# Patient Record
Sex: Female | Born: 1965 | Race: White | Hispanic: No | Smoking: Current every day smoker
Health system: Southern US, Community
[De-identification: ages and names within clinical notes are randomized; demographics above are authoritative.]

## PROBLEM LIST (undated history)

## (undated) DIAGNOSIS — M1990 Unspecified osteoarthritis, unspecified site: Secondary | ICD-10-CM

## (undated) DIAGNOSIS — D649 Anemia, unspecified: Secondary | ICD-10-CM

## (undated) DIAGNOSIS — K5190 Ulcerative colitis, unspecified, without complications: Secondary | ICD-10-CM

## (undated) DIAGNOSIS — G629 Polyneuropathy, unspecified: Secondary | ICD-10-CM

## (undated) DIAGNOSIS — L80 Vitiligo: Secondary | ICD-10-CM

## (undated) DIAGNOSIS — Z87442 Personal history of urinary calculi: Secondary | ICD-10-CM

## (undated) DIAGNOSIS — K589 Irritable bowel syndrome without diarrhea: Secondary | ICD-10-CM

## (undated) DIAGNOSIS — N809 Endometriosis, unspecified: Secondary | ICD-10-CM

## (undated) DIAGNOSIS — M797 Fibromyalgia: Secondary | ICD-10-CM

## (undated) DIAGNOSIS — G43909 Migraine, unspecified, not intractable, without status migrainosus: Secondary | ICD-10-CM

## (undated) DIAGNOSIS — E538 Deficiency of other specified B group vitamins: Secondary | ICD-10-CM

## (undated) DIAGNOSIS — E079 Disorder of thyroid, unspecified: Secondary | ICD-10-CM

## (undated) DIAGNOSIS — E039 Hypothyroidism, unspecified: Secondary | ICD-10-CM

## (undated) DIAGNOSIS — F32A Depression, unspecified: Secondary | ICD-10-CM

## (undated) DIAGNOSIS — A6000 Herpesviral infection of urogenital system, unspecified: Secondary | ICD-10-CM

## (undated) DIAGNOSIS — K219 Gastro-esophageal reflux disease without esophagitis: Secondary | ICD-10-CM

## (undated) DIAGNOSIS — F419 Anxiety disorder, unspecified: Secondary | ICD-10-CM

## (undated) DIAGNOSIS — N059 Unspecified nephritic syndrome with unspecified morphologic changes: Secondary | ICD-10-CM

## (undated) HISTORY — PX: COLONOSCOPY: SHX174

## (undated) HISTORY — PX: TUBAL LIGATION: SHX77

## (undated) HISTORY — DX: Migraine, unspecified, not intractable, without status migrainosus: G43.909

## (undated) HISTORY — DX: Unspecified nephritic syndrome with unspecified morphologic changes: N05.9

## (undated) HISTORY — DX: Irritable bowel syndrome, unspecified: K58.9

## (undated) HISTORY — DX: Disorder of thyroid, unspecified: E07.9

## (undated) HISTORY — DX: Fibromyalgia: M79.7

## (undated) HISTORY — PX: TONSILLECTOMY: SUR1361

## (undated) HISTORY — PX: ABDOMINAL HYSTERECTOMY: SHX81

## (undated) HISTORY — PX: HERNIA REPAIR: SHX51

## (undated) HISTORY — DX: Vitiligo: L80

---

## 2001-10-09 ENCOUNTER — Encounter: Payer: Self-pay | Admitting: *Deleted

## 2001-10-09 ENCOUNTER — Encounter: Admission: RE | Admit: 2001-10-09 | Discharge: 2001-10-09 | Payer: Self-pay | Admitting: *Deleted

## 2001-12-10 ENCOUNTER — Other Ambulatory Visit: Admission: RE | Admit: 2001-12-10 | Discharge: 2001-12-10 | Payer: Self-pay | Admitting: *Deleted

## 2001-12-10 ENCOUNTER — Encounter (INDEPENDENT_AMBULATORY_CARE_PROVIDER_SITE_OTHER): Payer: Self-pay

## 2001-12-24 ENCOUNTER — Encounter: Payer: Self-pay | Admitting: *Deleted

## 2001-12-24 ENCOUNTER — Ambulatory Visit (HOSPITAL_COMMUNITY): Admission: RE | Admit: 2001-12-24 | Discharge: 2001-12-24 | Payer: Self-pay | Admitting: *Deleted

## 2002-04-03 ENCOUNTER — Encounter (INDEPENDENT_AMBULATORY_CARE_PROVIDER_SITE_OTHER): Payer: Self-pay

## 2002-04-04 ENCOUNTER — Inpatient Hospital Stay (HOSPITAL_COMMUNITY): Admission: RE | Admit: 2002-04-04 | Discharge: 2002-04-05 | Payer: Self-pay | Admitting: Obstetrics and Gynecology

## 2002-04-12 ENCOUNTER — Inpatient Hospital Stay (HOSPITAL_COMMUNITY): Admission: AD | Admit: 2002-04-12 | Discharge: 2002-04-12 | Payer: Self-pay | Admitting: Obstetrics and Gynecology

## 2002-04-12 ENCOUNTER — Encounter: Payer: Self-pay | Admitting: Obstetrics and Gynecology

## 2002-04-16 ENCOUNTER — Inpatient Hospital Stay (HOSPITAL_COMMUNITY): Admission: AD | Admit: 2002-04-16 | Discharge: 2002-04-19 | Payer: Self-pay | Admitting: Obstetrics and Gynecology

## 2004-03-22 ENCOUNTER — Other Ambulatory Visit: Admission: RE | Admit: 2004-03-22 | Discharge: 2004-03-22 | Payer: Self-pay | Admitting: Obstetrics and Gynecology

## 2006-03-17 ENCOUNTER — Encounter: Admission: RE | Admit: 2006-03-17 | Discharge: 2006-03-17 | Payer: Self-pay | Admitting: Physician Assistant

## 2006-05-03 ENCOUNTER — Encounter: Admission: RE | Admit: 2006-05-03 | Discharge: 2006-05-03 | Payer: Self-pay | Admitting: Family Medicine

## 2006-11-16 ENCOUNTER — Ambulatory Visit: Payer: Self-pay | Admitting: Hematology & Oncology

## 2006-12-29 LAB — CBC WITH DIFFERENTIAL/PLATELET
BASO%: 1.6 % (ref 0.0–2.0)
EOS%: 1.2 % (ref 0.0–7.0)
MCH: 28.8 pg (ref 26.0–34.0)
MCHC: 33.7 g/dL (ref 32.0–36.0)
MONO#: 1.4 10*3/uL — ABNORMAL HIGH (ref 0.1–0.9)
RDW: 15.8 % — ABNORMAL HIGH (ref 11.3–14.5)
WBC: 14.9 10*3/uL — ABNORMAL HIGH (ref 3.9–10.0)
lymph#: 6.5 10*3/uL — ABNORMAL HIGH (ref 0.9–3.3)

## 2006-12-29 LAB — CHCC SMEAR

## 2007-01-05 LAB — JAK2 GENOTYPR

## 2008-10-01 ENCOUNTER — Encounter: Admission: RE | Admit: 2008-10-01 | Discharge: 2008-10-01 | Payer: Self-pay | Admitting: Family Medicine

## 2009-05-22 ENCOUNTER — Emergency Department: Payer: Self-pay | Admitting: Emergency Medicine

## 2009-06-10 ENCOUNTER — Ambulatory Visit: Payer: Self-pay | Admitting: Gastroenterology

## 2009-06-11 ENCOUNTER — Ambulatory Visit: Payer: Self-pay | Admitting: Gastroenterology

## 2009-06-24 ENCOUNTER — Ambulatory Visit: Payer: Self-pay | Admitting: Gastroenterology

## 2010-06-25 NOTE — Op Note (Signed)
[name], [name]                           ACCOUNT NO.:  [id number]   MEDICAL RECORD NO.:  [id number]                   PATIENT TYPE:  OUT   LOCATION:  XRAY                                 FACILITY:  [facility]   PHYSICIAN:  Janine Limbo, M.D.            DATE OF BIRTH:  [birth year]   DATE OF PROCEDURE:  04/03/2002  DATE OF DISCHARGE:  12/24/2001                                 OPERATIVE REPORT   PREOPERATIVE DIAGNOSIS:  1. Fibroid uterus.  2. Menometrorrhagia.  3. Dysmenorrhea.  4. Dyspareunia.  5. Pelvic relaxation with a cystocele, rectocele and uterine prolapse.  6. Endometriosis.  7. History of ulcerative colitis.   POSTOPERATIVE DIAGNOSIS:  1. Fibroid uterus.  2. Menometrorrhagia.  3. Dysmenorrhea.  4. Dyspareunia.  5. Pelvic relaxation with a cystocele, rectocele and uterine prolapse.  6. Endometriosis.  7. History of ulcerative colitis.   OPERATION PERFORMED:  1. Laparoscopically assisted vaginal hysterectomy.  2. Laparoscopic bilateral salpingo-oophorectomy.  3. Anterior and posterior colporrhaphy.  4. Cystoscopy.   SURGEON:  Janine Limbo, M.D.   ASSISTANT:  Marquis Lunch. Adline Peals.   ANESTHESIA:  General.   INDICATIONS FOR PROCEDURE:  The patient is a 45 year old female who presents  with the above mentioned diagnosis.  She understands the indications for her  procedure and she accepts the risks of but not limited to anesthetic  complications, bleeding, infections, and possible damage to the surrounding  organs.   FINDINGS:  The uterus was 8 to 10 weeks size.  Her tubes and ovaries are  normal except for defects from the patient's prior tubal ligation.  There  was no evidence of ongoing endometriosis in the pelvis.  The appendix was  slightly adhered to the right pelvic side wall.  The upper abdomen appeared  normal.  The patient was noted to have a moderate cystocele and moderate  rectocele.  The urethrovesical angle was well preserved,  however.  Cystoscopy was performed at the end of our procedure and the inner bladder  was noted to be normal.  There was dye that passed through both ureteral  orifices.   DESCRIPTION OF PROCEDURE:  The patient was taken to the operating room where  a general anesthetic was given.  The patient's abdomen, perineum, and vagina  were prepped with multiple layers of Hibiclens.  A Hulka tenaculum was  placed inside the uterus.  A Foley catheter was placed inside the bladder.  The patient was sterilely draped.  The subumbilical area was injected with 6  cc of 0.5% Marcaine with epinephrine.  An incision was made and carried  sharply through the subcutaneous tissue, the fascia, and the anterior  peritoneum.  The Hasson cannula was sutured into place.  A pneumoperitoneum  was then obtained.  The pelvic organs were visualized with findings as  mentioned above.  Two superpubic areas were injected with a total of 4 cc of  0.5%  Marcaine with epinephrine.  Two incisions were made and two 5 mm  trocars were inserted into the lower abdomen under direct visualization.  Pictures were taken of the patient's pelvic anatomy.  The ureters were then  identified bilaterally.  The utero-ovarian ligament was cauterized on the  right side.  The ligament was cut and the right infundibulopelvic ligament  was skeletonized.  2-0 Vicryl Endoloop sutures were placed around the right  infundibulopelvic ligament.  The  ligament was cut and the ovary and  fallopian tube from the right side were placed in the posterior cul-de-sac.  An identical procedure was carried out on the opposite side.  There was a  slight amount of bleeding and hemostasis was achieved using Endoloop sutures  and cautery.  We felt that we were then ready to proceed with the vaginal  portion of our procedure.  The cervix was then injected with a diluted  solution of Pitressin and saline.  A circumferential incision was made  around the cervix and the  vaginal mucosa was advanced both anteriorly and  posteriorly.  Alternating left to right, the uterosacral ligaments,  paracervical tissues, parametrial tissues, and uterine arteries were  clamped, cut, sutured and tied securely.  The uterus was everted through the  posterior colpotomy.  The upper pedicles were then secured and cut.  The  uterus was removed from our operative field.  The upper pedicles were then  free tied and then suture ligated.  Hemostasis was noted to be adequate on  the right.  At this point we did note some bleeding on the left.  Hemostasis  was attempted but the bleeding was noted to be high in the pelvis.  We then  brought the sutures attached to the uterosacral ligaments out the fascial  angle.  They were tied securely.  A McCall culdoplasty suture was placed in  the posterior cul-de-sac.  We put several figure-of-eight sutures in the  vaginal cuff and then we returned to the laparoscopic portion of our  procedure.  The surgeon did change gloves and gown.  The pneumoperitoneum  was re-established.  The patient was noted to have bleeding from the left  infundibulopelvic ligament.  Two additional 0 Vicryl Endo loop sutures were  placed around the ligament.  The area was cauterized.  At this point  hemostasis was adequate.  The pelvis and abdomen were vigorously irrigated  and the fluid was aspirated.  The vaginal cuff was checked as was the right  pelvic side wall.  At this point hemostasis was adequate throughout.  Once  again care was taken not to damage any of the pelvic side wall structures or  the abdominal structures.  The bowel was carefully checked and there was no  evidence of trocar damage.  The pneumoperitoneum was then allowed to escape.  All instruments were removed.  The subumbilical incision was closed using  deep sutures of 0 Vicryl followed by a subcuticular suture of 3-0 Vicryl.  The suprapubic areas were closed using 3-0 Vicryl.  We were ready then  to proceed with the colporrhaphy.  The mucosa of the vagina anteriorly was then  injected with a diluted solution of Pitressin and saline.  An incision was  made along the midline and the vaginal mucosa was advanced from the  endopelvic fascia that supports the bladder.  The endopelvic fascia was then  reinforced in the midline using 2-0 Vicryl and 0 Vicryl.  The excess vaginal  mucosa was excised.  We then  closed the vaginal mucosa using interrupted  sutures of 0 chromic.  Hemostasis was adequate.  We then proceeded with the  posterior colporrhaphy.  The posterior vaginal mucosa was then injected with  a diluted solution of Pitressin and saline.  The mucosa was cut and the  vaginal mucosa was separated from the endopelvic fascia.  The endopelvic  fascia was then reinforced in the midline.  The perineal body was  reinforced.  The excess vaginal mucosa posteriorly was excised.  The vaginal  mucosa was then closed using a running suture of 0 Vicryl.  Hemostasis was  noted to be adequate.  We then gave the patient one ampule of indigo carmine  dye.  The cystoscope was inserted into the bladder and the bladder mucosa  was inspected.  There was no evidence of damage.  Blue dye was noted to pass  through both ureteral orifices.  The cystoscope was then removed.  The Foley  catheter was reinserted.  The vagina was packed with one-inch gauze soaked  in estrogen vaginal cream.  Sponge, needle and instrument counts were  correct for the  procedure.  The estimated blood loss was 750 cc.  The urine output was 475  ml and it was blue colored but clear.  The patient tolerated the procedure  well.  0 Vicryl was the suture material used except where otherwise  mentioned.  The patient was then awakened from her anesthetic and taken to  the recovery room in stable condition.                                                Janine Limbo, M.D.    AVS/MEDQ  D:  04/03/2002  T:  04/03/2002  Job:   [id number]

## 2010-06-25 NOTE — Discharge Summary (Signed)
[name], [name]                           ACCOUNT NO.:  [id number]   MEDICAL RECORD NO.:  [id number]                   PATIENT TYPE:  INP   LOCATION:  9137                                 FACILITY:  [facility]   PHYSICIAN:  Janine Limbo, M.D.            DATE OF BIRTH:  1965-07-20   DATE OF ADMISSION:  04/16/2002  DATE OF DISCHARGE:  04/19/2002                                 DISCHARGE SUMMARY   DISCHARGE DIAGNOSES:  1. Abdominal and perineal pain of uncertain etiology.  2. Reported fever of uncertain etiology.  3. Status post laparoscopically-assisted vaginal hysterectomy with bilateral     salpingo-oophorectomy and anterior/posterior colporrhaphy (April 03, 2002).  4. History of leukocytosis with a white blood cell count of 19,000.  5. History of ulcerative colitis.   HISTORY OF PRESENT ILLNESS:  The patient is 45 year old female, para 2-0-2-  2, who was readmitted to [facility] following a  laparoscopically-assisted vaginal hysterectomy with bilateral salpingo-  oophorectomy and anterior and posterior colporrhaphy on April 03, 2002.  The patient's surgery was uncomplicated and her final pathology revealed  adenomyosis.  The patient also underwent a cystoscopy at the time of that  procedure, however, both of the patient's ureters were noted to be patent.  The patient presented to [facility] on March 5, with complaint of  severe pain and was sent to [facility] for pelvic  ultrasound and an abdominal series.  The patient's pelvic ultrasound  revealed a fluid collection in her left adnexa measuring 2.9 x 1.0 x 3.1 cm  with questionable postoperative hematoma, seroma, lymphocele and though  infection could not be excluded.  The patient's abdominal series was  negative for obstruction.  The patient's complete blood count was within  normal limits with the exception of the patient having chronic leukocytosis,  her  comprehensive metabolic panel was within normal limits, and her  urinalysis was negative.  The patient presented again to [facility]  on March 9, complaining of persistent pelvic and perineal discomfort and  therefore was admitted for pain management.  Please see the patient's  dictated history and physical examination for details.   PHYSICAL EXAMINATION:  VITAL SIGNS:  Weight 147 pounds.  GENERAL:  Within normal limits.  ABDOMEN:  Tender in the mid to lower quadrants. There were no appreciable  masses.  Incisions from her laparoscopy were well-healed.  PELVIC:  External genitalia shows a healing anterior/posterior repair  incision. Vagina shows slight brownish discharge and a healing vaginal cuff.  Cervix is absent, uterus is absent. There is fullness in the mid pelvis.  Adnexa tender on the left.  Rectovaginal examination again with tenderness  at the vaginal cuff.   HOSPITAL COURSE:  On the date of admission, the patient was placed on Unasyn  antibiotics IV along with analgesia with the patient slowly responding to  both  and by hospital day #3, felt much better, however, developed a migraine  headache at that time.  On hospital day #4, the patient was believed to have  achieved the maximum benefit of her hospital stay in that she was pain-free,  tolerating a regular diet, and voiding without difficulty.  Therefore, she  was discharged home.   DISCHARGE MEDICATIONS:  1. Dilaudid 2 mg one tablet every three hours as needed for pain.  2. Augmentin 500 mg one tablet three times a day for seven days.  3. Premarin 0.625 mg one tablet daily.  4. The patient also had the option of using the Climara patch 0.05 mg weekly     should she decide not to use the Premarin tablet.   FOLLOW UP:  She is to follow up with Dr. Stefano Gaul as previously scheduled  from her February surgery.   DISCHARGE INSTRUCTIONS:  The patient is to perform her daily activities as  tolerated and to call  [facility] for any problems or concerns.     [name].                    Janine Limbo, M.D.    EJP/MEDQ  D:  05/20/2002  T:  05/20/2002  Job:  [id number]

## 2010-06-25 NOTE — Discharge Summary (Signed)
[name], [name]                           ACCOUNT NO.:  [id number]   MEDICAL RECORD NO.:  [id number]                   PATIENT TYPE:  INP   LOCATION:  9327                                 FACILITY:  [facility]   PHYSICIAN:  Janine Limbo, M.D.            DATE OF BIRTH:  05/13/1965   DATE OF ADMISSION:  04/03/2002  DATE OF DISCHARGE:  04/05/2002                                 DISCHARGE SUMMARY   DISCHARGE DIAGNOSES:  1. Fibroid uterus.  2. Adenomyosis.  3. Menometrorrhagia.  4. Dysmenorrhea.  5. Dyspareunia.  6. Pelvic relaxation.  7. Cystocele.  8. Rectocele.  9. Uterine prolapse.  10.      Endometriosis.   PROCEDURES:  On the date of admission, the patient underwent a laparoscopic  bilateral salpingo-oophorectomy, a total vaginal hysterectomy, anterior  posterior colporrhaphy, cystoscopy, tolerating all procedures well.  The  patient was found to have an 8 to 10-wee size uterus with normal-appearing  tubes and ovaries, a second-degree cystocele/rectocele with uterine prolapse  three-quarters the length of her vagina.  The patient was also found to have  ureters which were patent bilaterally.   HISTORY OF PRESENT ILLNESS:  The patient is a 45 year old female, para 2-0-2-  2 who presents for a laparoscopic-assisted vaginal hysterectomy with  bilateral salpingo-oophorectomy along with anterior and posterior  colporrhaphy due to symptomatic uterine fibroids, history of endometriosis,  metrorrhagia, dysmenorrhea, and severe dyspareunia.  Please see patient's  dictated History and Physical for complete details.   PHYSICAL EXAMINATION:  VITAL SIGNS:  Weight 147 pounds.  GENERAL:  Within normal limits.  PELVIC:  External genitalia normal. Vagina shows a cystocele and rectocele  as previously described actually good support of the urethrovesical angle.  The cervix was nontender, though there was prolapse one-half to three-  quarter the length of the vagina.  The uterus was 8  to 10-week size and  firm.  Adnexa without masses.  Rectovaginal exam confirms.   HOSPITAL COURSE:  On the day of admission, the patient underwent  aforementioned procedures tolerating them all well.  Her postoperative  course was unremarkable, resuming bowel and bladder function by postop day  #2 and, therefore, deemed ready for discharge home.  Postop hemoglobin as  10.7 (preop hemoglobin was 14.3).   DISCHARGE MEDICATIONS:  1. Dilaudid 2 mg 1 to 2 tablets every 4 to 6 hours as needed for pain.  2. Phenergan 25 mg 1 tablet every 6 hours as needed for nausea.  3. Colace 100 mg 1 tablet twice daily until bowel movements are normal.  4. Iron 325 mg 1 tablet twice daily for 6 weeks.  5. Estrace vaginal clean 1/4 applicator twice weekly for 6 weeks.  6. Estradiol patch 0.05 mg 1 patch weekly.   FOLLOW UP:  The patient was to call [facility]  for a six-week postoperative visit with Dr. Stefano Gaul.   DISCHARGE INSTRUCTIONS:  The  patient was given a copy of [facility] postoperative instruction sheet.  She was further advised to avoid  driving for two weeks, heavy lifting for four weeks, and intercourse for six  weeks.   FINAL PATHOLOGY:  Uterus, ovaries and fallopian tubes, cervix: No pathologic  abnormalities identified, benign proliferative endometrium, adenomyosis,  bilateral fallopian tubes associated with benign paratubal cyst, bilateral  ovaries with no pathologic abnormalities identified.     [place] J. Adline Peals.                    Janine Limbo, M.D.    EJP/MEDQ  D:  05/20/2002  T:  05/20/2002  Job:  [id number]

## 2010-06-25 NOTE — H&P (Signed)
[name], [name]                             ACCOUNT NO.:  [id number]   MEDICAL RECORD NO.:  [id number]                   PATIENT TYPE:   LOCATION:                                       FACILITY:   PHYSICIAN:  Janine Limbo, M.D.            DATE OF BIRTH:   DATE OF ADMISSION:  DATE OF DISCHARGE:                                HISTORY & PHYSICAL   DATE OF SURGERY:  April 03, 2002.   HISTORY OF PRESENT ILLNESS:  The patient is a 45 year old female, para 2-0-2-  2, who presents for a laparoscopy-assisted vaginal hysterectomy, bilateral  salpingo-oophorectomy, anterior and posterior colporrhaphy.  The patient has  a history of fibroids.  She also has a history of endometriosis.  She  complains of metrorrhagia, dysmenorrhea, and severe dyspareunia.  The  patient has had at least 4 diagnostic laparoscopies.  She has also had a  bilateral tubal ligation.  She has had 2 D&Cs performed.  Pain medication  and hormonal medication have not controlled her discomfort.  She also has a  history of Lupron injections in the past.  At this point, the patient is  ready to proceed with definitive therapy.  The patient's past history  includes a history of Chlamydia and trichomoniasis.   PAST MEDICAL HISTORY:  The patient complains of migraine headaches.  She  also has a history of ulcerative colitis.  She has had a right inguinal  hernia repair.  She was told in the past that she was hypothyroid.  She has  had laparoscopic knee surgery.  She has had her wisdom teeth removed as well  as her tonsils.   DRUG ALLERGIES:  The patient says she is allergic to ASPIRIN, IODINE and  ULTRAM.   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  She smokes one-half pack of cigarettes each day.  She drinks alcohol  socially.  She denies other recreational drug uses.   REVIEW OF SYSTEMS:  Please see  history of present illness.   FAMILY HISTORY:  Noncontributory.   PHYSICAL EXAMINATION:  VITAL SIGNS:  Weight is 147 pounds.  HEENT:  Within normal limits.  CHEST:  Clear.  HEART:  Regular rate and rhythm.  BREASTS:  Without masses.  ABDOMEN:  Nontender.  EXTREMITIES:  Within normal limits.  NEUROLOGIC EXAM:  Grossly normal.  PELVIC EXAM:  External genitalia is normal.  Her vagina shows a cystocele  and rectocele.  There is actually good support of the urethrovesical angle.  The cervix is nontender though it does prolapse half the length of the  vagina.  The uterus is 8 to 10 weeks' size and firm.  Adnexa, no masses.  Rectovaginal exam confirms the above.   ASSESSMENT:  1. An 8- to 10-week size fibroid uterus.  2. Menometrorrhagia.  3. Dysmenorrhea.  4. Dyspareunia.  5. Pelvic relaxation with a cystocele, rectocele, and uterine prolapse.  6. History of endometriosis.  7. History of ulcerative colitis.  8. Iodine allergy.   PLAN:  The patient will undergo a laparoscopy-assisted vaginal hysterectomy.  Because of her long history of discomfort, endometriosis, and multiple  ovarian cysts, the patient has decided to proceed with bilateral salpingo-  oophorectomy.  She understands that estrogen replacement therapy will be  recommended.  We have discussed the [facility] and the  implications or hormone replacement therapy.  She understands the potential  increased risk of breast cancer, heart attacks, strokes, and blood clots.  She is willing to accept those risks as she wants to proceed with bilateral  oophorectomy.  In addition, we will perform an anterior and posterior  colporrhaphy.  The patient did have an endometrial biopsy performed that was  within normal limits.  Her most recent Pap smear was also within normal  limits.                                               Janine Limbo, M.D.    AVS/MEDQ  D:  04/02/2002  T:  04/02/2002  Job:  [id number]

## 2010-06-25 NOTE — H&P (Signed)
[name], [name]                           ACCOUNT NO.:  [id number]   MEDICAL RECORD NO.:  [id number]                   PATIENT TYPE:  INP   LOCATION:  9137                                 FACILITY:  [facility]   PHYSICIAN:  Janine Limbo, M.D.            DATE OF BIRTH:  14-May-1965   DATE OF ADMISSION:  04/16/2002  DATE OF DISCHARGE:                                HISTORY & PHYSICAL   HISTORY OF PRESENT ILLNESS:  [name] is a 45 year old female, Para II,  0/2/2 who is re-admitted to the [facility] following a  laparoscopy assisted vaginal hysterectomy, bilateral salpingo-oophorectomy  and anterior and posterior colporrhaphy on April 03, 2002. The patient's  surgery was uncomplicated. The final pathology report returned showing  adenomyosis. Cystoscopy was performed at the end of the patient's procedure  and the patient's ureters were noted to be patent. The patient did remain in  the hospital for two days postoperatively rather than one because of pelvic  discomfort. At the time of discharge, she was feeling much better. The  patient had a slight improvement in her postoperative discomfort but then  reported that she began having greater discomfort rather than less. She was  evaluated on April 12, 2002. She was found to have a hemoglobin of 13.6. Her  hematocrit was 40.3%. Her WBC count was 19,400. Of note, however, is that  the patient has a history of leukocytosis and her white count normally runs  between 15,000 and 17,000. Chemistries were performed and they were within  normal limits. A UA was performed and it was within normal limits. The  patient was given Macrobid by her family physician postoperatively and then  she was given Metronidazole at [facility] on April 12, 2002. The patient reports that she has not improved at  all and feels that re-admission is needed. Additional evaluation included an  ultrasound which showed a 3 cm fluid collection in the left adnexal area. No  other fluid collections or masses were noted. No true etiology was found for  the patient's discomfort. The patient reports that she has a fever at home  but her temperature in the office was 98.5. The patient complains of nausea  and vomiting but no ketones were noted on her urinalysis. The patient had a  normal bowel movement today but reports that normally, she only has a bowel  movement every 2-3 days. She denies diarrhea. She complains that her  perineum is extremely uncomfortable. She complains of a vaginal discharge  with an odor. In addition to the above, the patient's gynecologic history is  significant for a reported history of endometriosis. No endometriosis was  found on the pathology report from this surgery, however. The patient has  had at least four diagnostic laparoscopies in the past in addition to a  bilateral tubal ligation. She has had two D&C procedures  performed. The  patient's past history does include a history of Chlamydia and  Trichomoniasis in the past.   PAST MEDICAL HISTORY:  The patient has a history of migraine headaches. She  also has a history of ulcerative colitis. She has had a right inguinal  hernia repair. She was told in the past that she was hypothyroid. She has  had arthroscopic surgeries on her left knee. The patient had her wisdom  teeth removed as well as her tonsils.   ALLERGIES:  The patient says that she is allergic to ASPIRIN, IODINE, AND  ULTRAM.   SOCIAL HISTORY:  The patient is currently married and she works in Futures trader and account representative services for the [employer]. She smokes one half pack per day of cigarettes. She drinks alcohol  socially. She denies other recreational drug uses.   REVIEW OF SYSTEMS:  Please see history of present illness.   FAMILY HISTORY:  Noncontributory.   PHYSICAL EXAMINATION:  VITAL SIGNS: Weight  147 pounds.  HEENT: Within normal limits except for patient that complains of abdominal  discomfort.  CHEST: Clear.  HEART: Regular rate and rhythm.  BREAST: Without masses.  ABDOMEN: Tender in the mid to lower quadrants. No masses are appreciated.  The incisions from her laparoscopy are well healed.  EXTREMITIES: Within normal limits.  NEURO: Grossly normal examination.  PELVIC: (Performed previously) External genitalia shows a healing anterior  and posterior repair incision. Vagina shows a slight brownish discharge and  healing vaginal cuff. Cervix is absent. Uterus is absent. There is fullness  in the mid pelvis. Adnexa, tender left adnexa. Rectovaginal examination,  again with tenderness at the vaginal cuff.   ASSESSMENT:  1. Status post laparoscopy assisted vaginal hysterectomy and bilateral     salpingo-oophorectomy with anterior and posterior colporrhaphy on     February 25,2004.  2. Abdominal pain and perineal pain of uncertain etiology that is more than     would be expected from a normal postoperative course.  3. History of ulcerative colitis.  4. Reported fever of uncertain etiology.  5. History of leukocytosis with WBC count of 19,000 at this admission.   PLAN:  The patient will be admitted for observation. We will give the  patient Unasyn 3 grams IV every six hours. We will given her PCA pain  medications. We will repeat her labs. If there is no improvement, then we  will consider repeat surgery.                                               Janine Limbo, M.D.    AVS/MEDQ  D:  04/16/2002  T:  04/16/2002  Job:  [id number]

## 2010-07-24 IMAGING — NM NUCLEAR MEDICINE HEPATOHBILIARY INCLUDE GB
2 series · 12 of 12 positions shown · non-contrast
Comparison: none

REASON FOR EXAM: LUQ AND LLQ ABD PAIN NEG ABD US NAUSEA WITH VOMITING
COMMENTS:

[Series 1000: gallbladder dynamic · 4.80mm/px · 6 of 60 frames shown]
[frame 6/60]
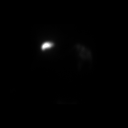
[frame 16/60]
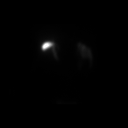
[frame 26/60]
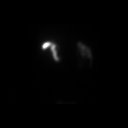
[frame 36/60]
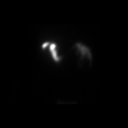
[frame 46/60]
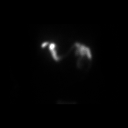
[frame 56/60]
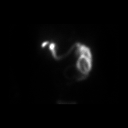

[Series 1000: gallbladder dynamic (results) · 4.80mm/px · 6 of 60 frames shown]
[frame 6/60  full-range]
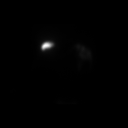
[frame 16/60  full-range]
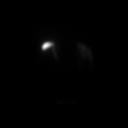
[frame 26/60  full-range]
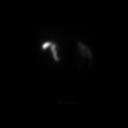
[frame 36/60  full-range]
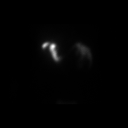
[frame 46/60  full-range]
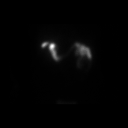
[frame 56/60  full-range]
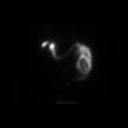

[12 of 12 positions shown; findings below may reference images not displayed]

PROCEDURE:     NM  - NM HEPATO WITH GB EJECT FRACTION  - June 24, 2009 [DATE]

RESULT:     Following intravenous administration of 7.58 mCi 2c-TTm
Choletec, there is noted prompt visualization of tracer activity in the
liver at 3 minutes. At one hour and 50 minutes, tracer activity is
visualized in the gallbladder, common duct and proximal small bowel.

The gallbladder ejection fraction at 30 minutes measured 90% which is within
the normal range.
IMPRESSION: 1. There is a nonspecific delay in visualization of tracer activity in the
bowel. The finding is of doubtful clinical significance since no dilatation
of the common duct is seen and there is observed near complete clearance of
tracer activity from the liver.
2. Gallbladder ejection fraction measures 90% which is in the normal range.

## 2018-11-09 DIAGNOSIS — F32A Depression, unspecified: Secondary | ICD-10-CM | POA: Insufficient documentation

## 2018-11-09 DIAGNOSIS — Z79899 Other long term (current) drug therapy: Secondary | ICD-10-CM | POA: Insufficient documentation

## 2018-11-09 DIAGNOSIS — F1010 Alcohol abuse, uncomplicated: Secondary | ICD-10-CM | POA: Insufficient documentation

## 2018-11-16 DIAGNOSIS — M0600 Rheumatoid arthritis without rheumatoid factor, unspecified site: Secondary | ICD-10-CM | POA: Insufficient documentation

## 2019-03-19 DIAGNOSIS — G629 Polyneuropathy, unspecified: Secondary | ICD-10-CM | POA: Insufficient documentation

## 2019-03-19 DIAGNOSIS — Z79899 Other long term (current) drug therapy: Secondary | ICD-10-CM | POA: Insufficient documentation

## 2019-03-19 DIAGNOSIS — M797 Fibromyalgia: Secondary | ICD-10-CM | POA: Insufficient documentation

## 2019-03-19 DIAGNOSIS — I7300 Raynaud's syndrome without gangrene: Secondary | ICD-10-CM | POA: Insufficient documentation

## 2019-11-05 ENCOUNTER — Telehealth (INDEPENDENT_AMBULATORY_CARE_PROVIDER_SITE_OTHER): Payer: Medicaid Other | Admitting: Psychiatry

## 2019-11-05 ENCOUNTER — Encounter: Payer: Self-pay | Admitting: Psychiatry

## 2019-11-05 ENCOUNTER — Other Ambulatory Visit: Payer: Self-pay

## 2019-11-05 DIAGNOSIS — F17200 Nicotine dependence, unspecified, uncomplicated: Secondary | ICD-10-CM

## 2019-11-05 DIAGNOSIS — F331 Major depressive disorder, recurrent, moderate: Secondary | ICD-10-CM | POA: Diagnosis not present

## 2019-11-05 DIAGNOSIS — Z79899 Other long term (current) drug therapy: Secondary | ICD-10-CM

## 2019-11-05 DIAGNOSIS — F1010 Alcohol abuse, uncomplicated: Secondary | ICD-10-CM | POA: Diagnosis not present

## 2019-11-05 DIAGNOSIS — F411 Generalized anxiety disorder: Secondary | ICD-10-CM | POA: Diagnosis not present

## 2019-11-05 MED ORDER — BUSPIRONE HCL 15 MG PO TABS: 15.0000 mg | ORAL_TABLET | Freq: Two times a day (BID) | ORAL | 2 refills | Status: AC
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
## 2019-12-10 ENCOUNTER — Other Ambulatory Visit: Payer: Self-pay

## 2019-12-10 ENCOUNTER — Telehealth (INDEPENDENT_AMBULATORY_CARE_PROVIDER_SITE_OTHER): Payer: Medicaid Other | Admitting: Psychiatry

## 2019-12-10 DIAGNOSIS — Z5329 Procedure and treatment not carried out because of patient's decision for other reasons: Secondary | ICD-10-CM

## 2019-12-10 NOTE — Progress Notes (Signed)
No response to call or text or video invite  

## 2020-04-23 ENCOUNTER — Other Ambulatory Visit: Payer: Medicaid Other

## 2020-04-24 ENCOUNTER — Other Ambulatory Visit: Payer: Self-pay

## 2020-04-24 ENCOUNTER — Other Ambulatory Visit
Admission: RE | Admit: 2020-04-24 | Discharge: 2020-04-24 | Disposition: A | Discharge status: Home / Self Care | Payer: Medicaid Other | Source: Ambulatory Visit | Attending: Gastroenterology | Admitting: Gastroenterology

## 2020-04-24 DIAGNOSIS — Z20822 Contact with and (suspected) exposure to covid-19: Secondary | ICD-10-CM | POA: Diagnosis not present

## 2020-04-24 DIAGNOSIS — Z01812 Encounter for preprocedural laboratory examination: Secondary | ICD-10-CM | POA: Diagnosis not present

## 2020-04-24 LAB — SARS CORONAVIRUS 2 (TAT 6-24 HRS): SARS Coronavirus 2: NEGATIVE

## 2020-04-27 ENCOUNTER — Encounter: Payer: Self-pay | Admitting: *Deleted

## 2020-04-28 ENCOUNTER — Ambulatory Visit
Admission: RE | Admit: 2020-04-28 | Discharge: 2020-04-28 | Disposition: A | Discharge status: Home / Self Care | Payer: Medicaid Other | Attending: Gastroenterology | Admitting: Gastroenterology

## 2020-04-28 ENCOUNTER — Other Ambulatory Visit: Payer: Self-pay

## 2020-04-28 ENCOUNTER — Encounter: Payer: Self-pay | Admitting: *Deleted

## 2020-04-28 ENCOUNTER — Ambulatory Visit: Payer: Medicaid Other | Admitting: Certified Registered Nurse Anesthetist

## 2020-04-28 ENCOUNTER — Encounter
Admission: RE | Disposition: A | Discharge status: Home / Self Care | Payer: Self-pay | Source: Home / Self Care | Attending: Gastroenterology

## 2020-04-28 DIAGNOSIS — Z888 Allergy status to other drugs, medicaments and biological substances status: Secondary | ICD-10-CM | POA: Insufficient documentation

## 2020-04-28 DIAGNOSIS — K6389 Other specified diseases of intestine: Secondary | ICD-10-CM | POA: Diagnosis not present

## 2020-04-28 DIAGNOSIS — M069 Rheumatoid arthritis, unspecified: Secondary | ICD-10-CM | POA: Insufficient documentation

## 2020-04-28 DIAGNOSIS — K2950 Unspecified chronic gastritis without bleeding: Secondary | ICD-10-CM | POA: Diagnosis not present

## 2020-04-28 DIAGNOSIS — Z886 Allergy status to analgesic agent status: Secondary | ICD-10-CM | POA: Insufficient documentation

## 2020-04-28 DIAGNOSIS — R1013 Epigastric pain: Secondary | ICD-10-CM | POA: Diagnosis not present

## 2020-04-28 DIAGNOSIS — K449 Diaphragmatic hernia without obstruction or gangrene: Secondary | ICD-10-CM | POA: Diagnosis not present

## 2020-04-28 DIAGNOSIS — K640 First degree hemorrhoids: Secondary | ICD-10-CM | POA: Insufficient documentation

## 2020-04-28 DIAGNOSIS — Z79899 Other long term (current) drug therapy: Secondary | ICD-10-CM | POA: Diagnosis not present

## 2020-04-28 DIAGNOSIS — R194 Change in bowel habit: Secondary | ICD-10-CM | POA: Diagnosis present

## 2020-04-28 DIAGNOSIS — Z87442 Personal history of urinary calculi: Secondary | ICD-10-CM | POA: Diagnosis not present

## 2020-04-28 DIAGNOSIS — Z79890 Hormone replacement therapy (postmenopausal): Secondary | ICD-10-CM | POA: Diagnosis not present

## 2020-04-28 HISTORY — DX: Polyneuropathy, unspecified: G62.9

## 2020-04-28 HISTORY — DX: Personal history of urinary calculi: Z87.442

## 2020-04-28 HISTORY — DX: Anxiety disorder, unspecified: F41.9

## 2020-04-28 HISTORY — DX: Herpesviral infection of urogenital system, unspecified: A60.00

## 2020-04-28 HISTORY — DX: Depression, unspecified: F32.A

## 2020-04-28 HISTORY — DX: Endometriosis, unspecified: N80.9

## 2020-04-28 HISTORY — DX: Deficiency of other specified B group vitamins: E53.8

## 2020-04-28 HISTORY — DX: Ulcerative colitis, unspecified, without complications: K51.90

## 2020-04-28 HISTORY — PX: ESOPHAGOGASTRODUODENOSCOPY (EGD) WITH PROPOFOL: SHX5813

## 2020-04-28 HISTORY — DX: Unspecified osteoarthritis, unspecified site: M19.90

## 2020-04-28 HISTORY — DX: Gastro-esophageal reflux disease without esophagitis: K21.9

## 2020-04-28 HISTORY — DX: Anemia, unspecified: D64.9

## 2020-04-28 HISTORY — PX: COLONOSCOPY WITH PROPOFOL: SHX5780

## 2020-04-28 HISTORY — DX: Hypothyroidism, unspecified: E03.9

## 2020-04-28 SURGERY — COLONOSCOPY WITH PROPOFOL
Anesthesia: General

## 2020-04-28 MED ORDER — PROPOFOL 10 MG/ML IV BOLUS
INTRAVENOUS | Status: AC
  Filled 2020-04-28: qty 20

## 2020-04-28 MED ORDER — SODIUM CHLORIDE 0.9 % IV SOLN: INTRAVENOUS | Status: DC

## 2020-04-28 MED ORDER — PROPOFOL 500 MG/50ML IV EMUL
INTRAVENOUS | Status: AC
  Filled 2020-04-28: qty 50

## 2020-04-28 MED ORDER — MIDAZOLAM HCL 5 MG/5ML IJ SOLN
INTRAMUSCULAR | Status: DC | PRN
  Administered 2020-04-28: 2 mg via INTRAVENOUS

## 2020-04-28 MED ORDER — PROPOFOL 10 MG/ML IV BOLUS
INTRAVENOUS | Status: DC | PRN
  Administered 2020-04-28: 20 mg via INTRAVENOUS
  Administered 2020-04-28: 30 mg via INTRAVENOUS
  Administered 2020-04-28: 50 mg via INTRAVENOUS

## 2020-04-28 MED ORDER — PROPOFOL 500 MG/50ML IV EMUL
INTRAVENOUS | Status: DC | PRN
  Administered 2020-04-28: 125 ug/kg/min via INTRAVENOUS

## 2020-04-28 MED ORDER — LIDOCAINE HCL (CARDIAC) PF 100 MG/5ML IV SOSY
PREFILLED_SYRINGE | INTRAVENOUS | Status: DC | PRN
  Administered 2020-04-28: 50 mg via INTRAVENOUS

## 2020-04-28 MED ORDER — MIDAZOLAM HCL 2 MG/2ML IJ SOLN
INTRAMUSCULAR | Status: AC
  Filled 2020-04-28: qty 2

## 2020-04-28 NOTE — H&P (Signed)
Outpatient short stay form Pre-procedure 04/28/2020 11:18 AM Nicole Lot MD, MPH  Primary Physician: PA Anna Genre  Reason for visit:  Epigastric pain/loose stool  History of present illness:   55 y/o lady with reported history of UC diagnosed in [place] on methotrexate for her RA here for EGD/Colonoscopy for epigastric pain. Had colonoscopy in 2020 that was normal. No blood thinners. No family history of GI malignancies.    Current Facility-Administered Medications:  .  0.9 %  sodium chloride infusion, , Intravenous, Continuous, Locklear, Rossie Muskrat, MD, Last Rate: 20 mL/hr at 04/28/20 1058, New Bag at 04/28/20 1058  Medications Prior to Admission  Medication Sig Dispense Refill Last Dose  . busPIRone (BUSPAR) 15 MG tablet Take 1 tablet (15 mg total) by mouth 2 (two) times daily. 60 tablet 2 04/27/2020 at 0800  . cyclobenzaprine (FLEXERIL) 10 MG tablet Take by mouth.   04/27/2020 at 0800  . DULoxetine (CYMBALTA) 60 MG capsule Take 1 capsule by mouth 2 (two) times daily.   04/27/2020 at 0800  . folic acid (FOLVITE) 1 MG tablet Take 1 mg by mouth daily.   04/27/2020 at 0800  . gabapentin (NEURONTIN) 300 MG capsule Take 300 mg by mouth 3 (three) times daily.    04/27/2020 at 2200  . hydroxychloroquine (PLAQUENIL) 200 MG tablet 200 mg daily on odd days and 400 mg on even days   04/27/2020 at 0800  . ibuprofen (ADVIL) 800 MG tablet Take by mouth.   Past Week at Unknown time  . levothyroxine (SYNTHROID) 100 MCG tablet Take by mouth.   04/27/2020 at 0800  . methotrexate (RHEUMATREX) 2.5 MG tablet TAKE 6  TABLETS = 15 mg BY MOUTH ONCE A WEEK , 12 weeks   04/27/2020 at 0800  . pantoprazole (PROTONIX) 40 MG tablet Take 40 mg by mouth daily.   04/27/2020 at 0800  . QUEtiapine (SEROQUEL) 25 MG tablet Take 25 mg by mouth at bedtime. Patient does not know the right dosage   04/27/2020 at 2200  . rizatriptan (MAXALT) 10 MG tablet Take by mouth.   04/27/2020 at 0800  . valACYclovir (VALTREX) 1000 MG tablet Take by  mouth.   04/27/2020 at 0800  . ARIPiprazole (ABILIFY) 2 MG tablet Take 2 mg by mouth daily. (Patient not taking: Reported on 04/28/2020)   Not Taking at Unknown time  . diazepam (VALIUM) 10 MG tablet Take 10 mg by mouth every 12 (twelve) hours as needed for anxiety. (Patient not taking: Reported on 04/28/2020)   Not Taking at Unknown time     Allergies  Allergen Reactions  . Contrast Media [Iodinated Diagnostic Agents] Other (See Comments)  . Aspirin     rash  . Iohexol Other (See Comments)     Code: RASH, Desc: hx of contrast reaction w/ ivp // rash , itching   Code: RASH, Desc: hx of contrast reaction w/ ivp // rash , itching  . Povidone-Iodine Other (See Comments)     Past Medical History:  Diagnosis Date  . Anemia   . Anxiety   . Arthritis   . 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 Depression   . Endometriosis   . Fibromyalgia   . Genital herpes   . GERD (gastroesophageal reflux disease)   . History of kidney stones   . Hypothyroidism   . IBS (irritable bowel syndrome)   . Migraine   . Peripheral neuropathy   . Thyroid disease   . Ulcerative colitis (HCC)   .  Vitamin B 12 deficiency   . Vitiligo     Review of systems:  Otherwise negative.    Physical Exam  Gen: Alert, oriented. Appears stated age.  HEENT: PERRLA. Lungs: No respiratory distress CV: RRR Abd: soft, benign, no masses Ext: No edema    Planned procedures: Proceed with EGD/colonoscopy. The patient understands the nature of the planned procedure, indications, risks, alternatives and potential complications including but not limited to bleeding, infection, perforation, damage to internal organs and possible oversedation/side effects from anesthesia. The patient agrees and gives consent to proceed.  Please refer to procedure notes for findings, recommendations and patient disposition/instructions.     Nicole Lot MD, MPH Gastroenterology 04/28/2020  11:18 AM

## 2020-04-28 NOTE — Op Note (Signed)
[facility] Gastroenterology Patient Name: [name] Procedure Date: 04/28/2020 11:23 AM MRN: [id number] Account #: [id number] Date of Birth: 03-Jan-1966 Admit Type: Outpatient Age: 55 Room: [facility] ENDO ROOM 1 Gender: Female Note Status: Finalized Procedure:             Upper GI endoscopy Indications:           Epigastric abdominal pain Providers:             Andrey Farmer MD, MD Referring MD:          Cyndi Bender (Referring MD) Medicines:             Monitored Anesthesia Care Complications:         No immediate complications. Estimated blood loss:                         Minimal. Procedure:             Pre-Anesthesia Assessment:                        - Prior to the procedure, a History and Physical was                         performed, and patient medications and allergies were                         reviewed. The patient is competent. The risks and                         benefits of the procedure and the sedation options and                         risks were discussed with the patient. All questions                         were answered and informed consent was obtained.                         Patient identification and proposed procedure were                         verified by the physician, the nurse, the anesthetist                         and the technician in the endoscopy suite. Mental                         Status Examination: alert and oriented. Airway                         Examination: normal oropharyngeal airway and neck                         mobility. Respiratory Examination: clear to                         auscultation. CV Examination: normal. Prophylactic                         Antibiotics:  The patient does not require prophylactic                         antibiotics. Prior Anticoagulants: The patient has                         taken no previous anticoagulant or antiplatelet                         agents. ASA Grade Assessment: II  - A patient with mild                         systemic disease. After reviewing the risks and                         benefits, the patient was deemed in satisfactory                         condition to undergo the procedure. The anesthesia                         plan was to use monitored anesthesia care (MAC).                         Immediately prior to administration of medications,                         the patient was re-assessed for adequacy to receive                         sedatives. The heart rate, respiratory rate, oxygen                         saturations, blood pressure, adequacy of pulmonary                         ventilation, and response to care were monitored                         throughout the procedure. The physical status of the                         patient was re-assessed after the procedure.                        After obtaining informed consent, the endoscope was                         passed under direct vision. Throughout the procedure,                         the patient's blood pressure, pulse, and oxygen                         saturations were monitored continuously. The Endoscope                         was introduced through the mouth, and advanced to the  second part of duodenum. The upper GI endoscopy was                         accomplished without difficulty. The patient tolerated                         the procedure well. Findings:      A small hiatal hernia was present.      The exam of the esophagus was otherwise normal.      Localized mildly erythematous mucosa without bleeding was found in the       gastric antrum. Biopsies were taken with a cold forceps for Helicobacter       pylori testing. Estimated blood loss was minimal.      The examined duodenum was normal. Impression:            - Small hiatal hernia.                        - Erythematous mucosa in the antrum. Biopsied.                        - Normal  examined duodenum. Recommendation:        - Perform a colonoscopy today. Procedure Code(s):     --- Professional ---                        [phone], Esophagogastroduodenoscopy, flexible,                         transoral; with biopsy, single or multiple Diagnosis Code(s):     --- Professional ---                        K44.9, Diaphragmatic hernia without obstruction or                         gangrene                        K31.89, Other diseases of stomach and duodenum                        R10.13, Epigastric pain CPT copyright 2019 American Medical Association. All rights reserved. The codes documented in this report are preliminary and upon coder review may  be revised to meet current compliance requirements. Andrey Farmer MD, MD 04/28/2020 11:57:09 AM Number of Addenda: 0 Note Initiated On: 04/28/2020 11:23 AM Estimated Blood Loss:  Estimated blood loss was minimal.      [facility]

## 2020-04-28 NOTE — Interval H&P Note (Signed)
History and Physical Interval Note:  04/28/2020 11:21 AM  [name]  has presented today for surgery, with the diagnosis of Diarrhea GERD.  The various methods of treatment have been discussed with the patient and family. After consideration of risks, benefits and other options for treatment, the patient has consented to  Procedure(s): COLONOSCOPY WITH PROPOFOL (N/A) ESOPHAGOGASTRODUODENOSCOPY (EGD) WITH PROPOFOL (N/A) as a surgical intervention.  The patient's history has been reviewed, patient examined, no change in status, stable for surgery.  I have reviewed the patient's chart and labs.  Questions were answered to the patient's satisfaction.     Regis Bill  Ok to proceed with EGD/Colonoscopy

## 2020-04-28 NOTE — Op Note (Signed)
[facility] Gastroenterology Patient Name: [name] Procedure Date: 04/28/2020 11:22 AM MRN: [id number] Account #: [id number] Date of Birth: Apr 08, 1965 Admit Type: Outpatient Age: 55 Room: [facility] ENDO ROOM 1 Gender: Female Note Status: Finalized Procedure:             Colonoscopy Indications:           Ulcerative colitis, Change in bowel habits Providers:             Andrey Farmer MD, MD Referring MD:          Cyndi Bender (Referring MD) Medicines:             Monitored Anesthesia Care Complications:         No immediate complications. Estimated blood loss:                         Minimal. Procedure:             Pre-Anesthesia Assessment:                        - Prior to the procedure, a History and Physical was                         performed, and patient medications and allergies were                         reviewed. The patient is competent. The risks and                         benefits of the procedure and the sedation options and                         risks were discussed with the patient. All questions                         were answered and informed consent was obtained.                         Patient identification and proposed procedure were                         verified by the physician, the nurse, the anesthetist                         and the technician in the endoscopy suite. Mental                         Status Examination: alert and oriented. Airway                         Examination: normal oropharyngeal airway and neck                         mobility. Respiratory Examination: clear to                         auscultation. CV Examination: normal. Prophylactic  Antibiotics: The patient does not require prophylactic                         antibiotics. Prior Anticoagulants: The patient has                         taken no previous anticoagulant or antiplatelet                         agents. ASA Grade  Assessment: II - A patient with mild                         systemic disease. After reviewing the risks and                         benefits, the patient was deemed in satisfactory                         condition to undergo the procedure. The anesthesia                         plan was to use monitored anesthesia care (MAC).                         Immediately prior to administration of medications,                         the patient was re-assessed for adequacy to receive                         sedatives. The heart rate, respiratory rate, oxygen                         saturations, blood pressure, adequacy of pulmonary                         ventilation, and response to care were monitored                         throughout the procedure. The physical status of the                         patient was re-assessed after the procedure.                        After obtaining informed consent, the colonoscope was                         passed under direct vision. Throughout the procedure,                         the patient's blood pressure, pulse, and oxygen                         saturations were monitored continuously. The                         Colonoscope was introduced through the anus and  advanced to the the terminal ileum. The colonoscopy                         was performed without difficulty. The patient                         tolerated the procedure well. The quality of the bowel                         preparation was fair. Findings:      The perianal and digital rectal examinations were normal.      The terminal ileum appeared normal.      Normal mucosa was found in the entire colon. Biopsies were taken with a       cold forceps for histology. Estimated blood loss was minimal.      Internal hemorrhoids were found during retroflexion. The hemorrhoids       were Grade I (internal hemorrhoids that do not prolapse).      The exam was otherwise  without abnormality on direct and retroflexion       views. Impression:            - Preparation of the colon was fair.                        - The examined portion of the ileum was normal.                        - Normal mucosa in the entire examined colon. Biopsied.                        - Internal hemorrhoids.                        - The examination was otherwise normal on direct and                         retroflexion views. Recommendation:        - Discharge patient to home.                        - Resume previous diet.                        - Continue present medications.                        - Await pathology results.                        - Repeat colonoscopy for surveillance based on                         pathology results.                        - Return to referring physician as previously                         scheduled. Procedure Code(s):     --- Professional ---  45380, Colonoscopy, flexible; with biopsy, single or                         multiple Diagnosis Code(s):     --- Professional ---                        K64.0, First degree hemorrhoids                        K51.90, Ulcerative colitis, unspecified, without                         complications                        R19.4, Change in bowel habit CPT copyright 2019 American Medical Association. All rights reserved. The codes documented in this report are preliminary and upon coder review may  be revised to meet current compliance requirements. Andrey Farmer MD, MD 04/28/2020 12:02:01 PM Number of Addenda: 0 Note Initiated On: 04/28/2020 11:22 AM Scope Withdrawal Time: 0 hours 10 minutes 57 seconds  Total Procedure Duration: 0 hours 16 minutes 39 seconds  Estimated Blood Loss:  Estimated blood loss was minimal.      [facility]

## 2020-04-28 NOTE — Anesthesia Preprocedure Evaluation (Addendum)
Anesthesia Evaluation  Patient identified by MRN, date of birth, ID band Patient awake    Reviewed: Allergy & Precautions, H&P , NPO status , Patient's Chart, lab work & pertinent test results  History of Anesthesia Complications Negative for: history of anesthetic complications  Airway Mallampati: II  TM Distance: >3 FB     Dental  (+) Teeth Intact   Pulmonary neg sleep apnea, neg COPD, Current Smoker,    breath sounds clear to auscultation       Cardiovascular (-) angina(-) Past MI and (-) Cardiac Stents negative cardio ROS  (-) dysrhythmias  Rhythm:regular Rate:Normal     Neuro/Psych  Headaches, PSYCHIATRIC DISORDERS Anxiety Depression    GI/Hepatic Neg liver ROS, PUD, GERD  ,  Endo/Other  Hypothyroidism   Renal/GU Renal disease  negative genitourinary   Musculoskeletal  (+) Arthritis , Fibromyalgia -  Abdominal   Peds  Hematology negative hematology ROS (+)   Anesthesia Other Findings Past Medical History: No date: Anemia No date: Anxiety No date: Arthritis 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 No date: Depression No date: Endometriosis No date: Fibromyalgia No date: Genital herpes No date: GERD (gastroesophageal reflux disease) No date: History of kidney stones No date: Hypothyroidism No date: IBS (irritable bowel syndrome) No date: Migraine No date: Peripheral neuropathy No date: Thyroid disease No date: Ulcerative colitis (HCC) No date: Vitamin B 12 deficiency No date: Vitiligo  Past Surgical History: No date: ABDOMINAL HYSTERECTOMY No date: COLONOSCOPY No date: HERNIA REPAIR No date: TONSILLECTOMY No date: TUBAL LIGATION  BMI    Body Mass Index: 22.47 kg/m      Reproductive/Obstetrics negative OB ROS                            Anesthesia Physical Anesthesia Plan  ASA: III  Anesthesia Plan: General   Post-op Pain Management:     Induction:   PONV Risk Score and Plan: Propofol infusion and TIVA  Airway Management Planned:   Additional Equipment:   Intra-op Plan:   Post-operative Plan:   Informed Consent: I have reviewed the patients History and Physical, chart, labs and discussed the procedure including the risks, benefits and alternatives for the proposed anesthesia with the patient or authorized representative who has indicated his/her understanding and acceptance.     Dental Advisory Given  Plan Discussed with: Anesthesiologist, CRNA and Surgeon  Anesthesia Plan Comments:         Anesthesia Quick Evaluation

## 2020-04-28 NOTE — Transfer of Care (Signed)
Immediate Anesthesia Transfer of Care Note  Patient: [name]  Procedure(s) Performed: COLONOSCOPY WITH PROPOFOL (N/A ) ESOPHAGOGASTRODUODENOSCOPY (EGD) WITH PROPOFOL (N/A )  Patient Location: PACU  Anesthesia Type:General  Level of Consciousness: awake, alert  and oriented  Airway & Oxygen Therapy: Patient Spontanous Breathing  Post-op Assessment: Report given to RN and Post -op Vital signs reviewed and stable  Post vital signs: Reviewed and stable  Last Vitals:  Vitals Value Taken Time  BP 152/77 04/28/20 1156  Temp    Pulse 91 04/28/20 1156  Resp 17 04/28/20 1156  SpO2 100 % 04/28/20 1156  Vitals shown include unvalidated device data.  Last Pain:  Vitals:   04/28/20 1051  TempSrc: Temporal  PainSc: 0-No pain         Complications: No complications documented.

## 2020-04-28 NOTE — Anesthesia Postprocedure Evaluation (Signed)
Anesthesia Post Note  Patient: Johnnetta Holstine Mikrut  Procedure(s) Performed: COLONOSCOPY WITH PROPOFOL (N/A ) ESOPHAGOGASTRODUODENOSCOPY (EGD) WITH PROPOFOL (N/A )  Patient location during evaluation: PACU Anesthesia Type: General Level of consciousness: awake and alert Pain management: pain level controlled Vital Signs Assessment: post-procedure vital signs reviewed and stable Respiratory status: spontaneous breathing, nonlabored ventilation and respiratory function stable Cardiovascular status: blood pressure returned to baseline and stable Postop Assessment: no apparent nausea or vomiting Anesthetic complications: no   No complications documented.   Last Vitals:  Vitals:   04/28/20 1206 04/28/20 1216  BP: 125/71 (!) 127/93  Pulse: 80 61  Resp: 17 19  Temp:    SpO2: 100% 92%    Last Pain:  Vitals:   04/28/20 1216  TempSrc:   PainSc: 0-No pain                 Aurelio Brash Raynetta Osterloh

## 2020-04-29 ENCOUNTER — Encounter: Payer: Self-pay | Admitting: Gastroenterology

## 2020-04-29 LAB — SURGICAL PATHOLOGY

## 2022-04-19 DIAGNOSIS — M069 Rheumatoid arthritis, unspecified: Secondary | ICD-10-CM | POA: Diagnosis not present

## 2022-04-19 DIAGNOSIS — R9431 Abnormal electrocardiogram [ECG] [EKG]: Secondary | ICD-10-CM | POA: Diagnosis not present

## 2022-04-19 DIAGNOSIS — L80 Vitiligo: Secondary | ICD-10-CM | POA: Diagnosis not present

## 2022-04-19 DIAGNOSIS — R0602 Shortness of breath: Secondary | ICD-10-CM | POA: Diagnosis not present

## 2022-04-19 DIAGNOSIS — S2232XA Fracture of one rib, left side, initial encounter for closed fracture: Secondary | ICD-10-CM | POA: Diagnosis not present

## 2022-04-19 DIAGNOSIS — R9389 Abnormal findings on diagnostic imaging of other specified body structures: Secondary | ICD-10-CM | POA: Diagnosis not present

## 2022-04-19 DIAGNOSIS — Z91041 Radiographic dye allergy status: Secondary | ICD-10-CM | POA: Diagnosis not present

## 2022-04-19 DIAGNOSIS — E079 Disorder of thyroid, unspecified: Secondary | ICD-10-CM | POA: Diagnosis not present

## 2022-04-19 DIAGNOSIS — Z79899 Other long term (current) drug therapy: Secondary | ICD-10-CM | POA: Diagnosis not present

## 2022-04-19 DIAGNOSIS — K5190 Ulcerative colitis, unspecified, without complications: Secondary | ICD-10-CM | POA: Diagnosis not present

## 2022-04-19 DIAGNOSIS — Z886 Allergy status to analgesic agent status: Secondary | ICD-10-CM | POA: Diagnosis not present

## 2022-04-19 DIAGNOSIS — R079 Chest pain, unspecified: Secondary | ICD-10-CM | POA: Diagnosis not present

## 2022-04-19 DIAGNOSIS — Z79890 Hormone replacement therapy (postmenopausal): Secondary | ICD-10-CM | POA: Diagnosis not present

## 2022-04-19 DIAGNOSIS — F32A Depression, unspecified: Secondary | ICD-10-CM | POA: Diagnosis not present

## 2022-04-19 DIAGNOSIS — F17210 Nicotine dependence, cigarettes, uncomplicated: Secondary | ICD-10-CM | POA: Diagnosis not present

## 2022-04-19 DIAGNOSIS — R0781 Pleurodynia: Secondary | ICD-10-CM | POA: Diagnosis not present

## 2022-04-19 DIAGNOSIS — J9811 Atelectasis: Secondary | ICD-10-CM | POA: Diagnosis not present

## 2022-04-19 DIAGNOSIS — F419 Anxiety disorder, unspecified: Secondary | ICD-10-CM | POA: Diagnosis not present

## 2022-04-20 DIAGNOSIS — S2249XA Multiple fractures of ribs, unspecified side, initial encounter for closed fracture: Secondary | ICD-10-CM | POA: Diagnosis not present

## 2022-05-04 DIAGNOSIS — F32A Depression, unspecified: Secondary | ICD-10-CM | POA: Diagnosis not present

## 2022-05-04 DIAGNOSIS — K5190 Ulcerative colitis, unspecified, without complications: Secondary | ICD-10-CM | POA: Diagnosis not present

## 2022-05-04 DIAGNOSIS — E538 Deficiency of other specified B group vitamins: Secondary | ICD-10-CM | POA: Diagnosis not present

## 2022-05-04 DIAGNOSIS — E039 Hypothyroidism, unspecified: Secondary | ICD-10-CM | POA: Diagnosis not present

## 2022-05-04 DIAGNOSIS — R413 Other amnesia: Secondary | ICD-10-CM | POA: Diagnosis not present

## 2022-05-04 DIAGNOSIS — Z79899 Other long term (current) drug therapy: Secondary | ICD-10-CM | POA: Diagnosis not present

## 2022-05-04 DIAGNOSIS — F419 Anxiety disorder, unspecified: Secondary | ICD-10-CM | POA: Diagnosis not present

## 2022-05-04 DIAGNOSIS — G629 Polyneuropathy, unspecified: Secondary | ICD-10-CM | POA: Diagnosis not present

## 2022-05-04 DIAGNOSIS — M069 Rheumatoid arthritis, unspecified: Secondary | ICD-10-CM | POA: Diagnosis not present

## 2022-05-04 DIAGNOSIS — G43009 Migraine without aura, not intractable, without status migrainosus: Secondary | ICD-10-CM | POA: Diagnosis not present

## 2022-05-04 DIAGNOSIS — D72829 Elevated white blood cell count, unspecified: Secondary | ICD-10-CM | POA: Diagnosis not present

## 2022-05-04 DIAGNOSIS — M549 Dorsalgia, unspecified: Secondary | ICD-10-CM | POA: Diagnosis not present

## 2022-05-04 DIAGNOSIS — Z1331 Encounter for screening for depression: Secondary | ICD-10-CM | POA: Diagnosis not present

## 2022-05-08 DIAGNOSIS — S2249XA Multiple fractures of ribs, unspecified side, initial encounter for closed fracture: Secondary | ICD-10-CM | POA: Diagnosis not present

## 2022-06-14 DIAGNOSIS — E039 Hypothyroidism, unspecified: Secondary | ICD-10-CM | POA: Diagnosis not present

## 2022-06-14 DIAGNOSIS — F32A Depression, unspecified: Secondary | ICD-10-CM | POA: Diagnosis not present

## 2022-06-14 DIAGNOSIS — A4151 Sepsis due to Escherichia coli [E. coli]: Secondary | ICD-10-CM | POA: Diagnosis not present

## 2022-06-14 DIAGNOSIS — E512 Wernicke's encephalopathy: Secondary | ICD-10-CM | POA: Diagnosis not present

## 2022-06-14 DIAGNOSIS — F17210 Nicotine dependence, cigarettes, uncomplicated: Secondary | ICD-10-CM | POA: Diagnosis not present

## 2022-06-14 DIAGNOSIS — R102 Pelvic and perineal pain: Secondary | ICD-10-CM | POA: Diagnosis not present

## 2022-06-14 DIAGNOSIS — G43909 Migraine, unspecified, not intractable, without status migrainosus: Secondary | ICD-10-CM | POA: Diagnosis not present

## 2022-06-14 DIAGNOSIS — I959 Hypotension, unspecified: Secondary | ICD-10-CM | POA: Diagnosis not present

## 2022-06-14 DIAGNOSIS — F419 Anxiety disorder, unspecified: Secondary | ICD-10-CM | POA: Diagnosis not present

## 2022-06-14 DIAGNOSIS — N12 Tubulo-interstitial nephritis, not specified as acute or chronic: Secondary | ICD-10-CM | POA: Diagnosis not present

## 2022-06-14 DIAGNOSIS — M069 Rheumatoid arthritis, unspecified: Secondary | ICD-10-CM | POA: Diagnosis not present

## 2022-06-14 DIAGNOSIS — F1010 Alcohol abuse, uncomplicated: Secondary | ICD-10-CM | POA: Diagnosis not present

## 2022-06-14 DIAGNOSIS — I10 Essential (primary) hypertension: Secondary | ICD-10-CM | POA: Diagnosis not present

## 2022-06-14 DIAGNOSIS — F05 Delirium due to known physiological condition: Secondary | ICD-10-CM | POA: Diagnosis not present

## 2022-06-14 DIAGNOSIS — R6521 Severe sepsis with septic shock: Secondary | ICD-10-CM | POA: Diagnosis not present

## 2022-06-14 DIAGNOSIS — F0390 Unspecified dementia without behavioral disturbance: Secondary | ICD-10-CM | POA: Diagnosis not present

## 2022-06-14 DIAGNOSIS — N390 Urinary tract infection, site not specified: Secondary | ICD-10-CM | POA: Diagnosis not present

## 2022-06-14 DIAGNOSIS — Z791 Long term (current) use of non-steroidal anti-inflammatories (NSAID): Secondary | ICD-10-CM | POA: Diagnosis not present

## 2022-06-14 DIAGNOSIS — N200 Calculus of kidney: Secondary | ICD-10-CM | POA: Diagnosis not present

## 2022-06-14 DIAGNOSIS — Z7960 Long term (current) use of unspecified immunomodulators and immunosuppressants: Secondary | ICD-10-CM | POA: Diagnosis not present

## 2022-06-14 DIAGNOSIS — N179 Acute kidney failure, unspecified: Secondary | ICD-10-CM | POA: Diagnosis not present

## 2022-06-24 DIAGNOSIS — E039 Hypothyroidism, unspecified: Secondary | ICD-10-CM | POA: Diagnosis not present

## 2022-06-24 DIAGNOSIS — A419 Sepsis, unspecified organism: Secondary | ICD-10-CM | POA: Diagnosis not present

## 2022-06-24 DIAGNOSIS — N10 Acute tubulo-interstitial nephritis: Secondary | ICD-10-CM | POA: Diagnosis not present

## 2022-06-24 DIAGNOSIS — F418 Other specified anxiety disorders: Secondary | ICD-10-CM | POA: Diagnosis not present

## 2022-06-24 DIAGNOSIS — N289 Disorder of kidney and ureter, unspecified: Secondary | ICD-10-CM | POA: Diagnosis not present

## 2022-06-24 DIAGNOSIS — R6521 Severe sepsis with septic shock: Secondary | ICD-10-CM | POA: Diagnosis not present

## 2022-06-24 DIAGNOSIS — Z1231 Encounter for screening mammogram for malignant neoplasm of breast: Secondary | ICD-10-CM | POA: Diagnosis not present

## 2022-06-29 DIAGNOSIS — R42 Dizziness and giddiness: Secondary | ICD-10-CM | POA: Diagnosis not present

## 2022-06-29 DIAGNOSIS — F32A Depression, unspecified: Secondary | ICD-10-CM | POA: Diagnosis not present

## 2022-06-29 DIAGNOSIS — N10 Acute tubulo-interstitial nephritis: Secondary | ICD-10-CM | POA: Diagnosis not present

## 2022-06-29 DIAGNOSIS — E039 Hypothyroidism, unspecified: Secondary | ICD-10-CM | POA: Diagnosis not present

## 2022-06-29 DIAGNOSIS — F419 Anxiety disorder, unspecified: Secondary | ICD-10-CM | POA: Diagnosis not present

## 2022-07-13 DIAGNOSIS — R42 Dizziness and giddiness: Secondary | ICD-10-CM | POA: Diagnosis not present

## 2022-07-13 DIAGNOSIS — F419 Anxiety disorder, unspecified: Secondary | ICD-10-CM | POA: Diagnosis not present

## 2022-07-13 DIAGNOSIS — E039 Hypothyroidism, unspecified: Secondary | ICD-10-CM | POA: Diagnosis not present

## 2022-07-13 DIAGNOSIS — F32A Depression, unspecified: Secondary | ICD-10-CM | POA: Diagnosis not present

## 2022-07-25 DIAGNOSIS — F1090 Alcohol use, unspecified, uncomplicated: Secondary | ICD-10-CM | POA: Diagnosis not present

## 2022-07-25 DIAGNOSIS — R413 Other amnesia: Secondary | ICD-10-CM | POA: Diagnosis not present

## 2022-07-25 DIAGNOSIS — R2689 Other abnormalities of gait and mobility: Secondary | ICD-10-CM | POA: Diagnosis not present

## 2022-07-25 DIAGNOSIS — G479 Sleep disorder, unspecified: Secondary | ICD-10-CM | POA: Diagnosis not present

## 2022-07-25 DIAGNOSIS — R200 Anesthesia of skin: Secondary | ICD-10-CM | POA: Diagnosis not present

## 2022-07-25 DIAGNOSIS — R4182 Altered mental status, unspecified: Secondary | ICD-10-CM | POA: Diagnosis not present

## 2022-07-25 DIAGNOSIS — E538 Deficiency of other specified B group vitamins: Secondary | ICD-10-CM | POA: Diagnosis not present

## 2022-07-25 DIAGNOSIS — R202 Paresthesia of skin: Secondary | ICD-10-CM | POA: Diagnosis not present

## 2022-07-25 DIAGNOSIS — F339 Major depressive disorder, recurrent, unspecified: Secondary | ICD-10-CM | POA: Diagnosis not present

## 2022-07-27 ENCOUNTER — Other Ambulatory Visit: Payer: Self-pay | Admitting: Physician Assistant

## 2022-07-27 DIAGNOSIS — R413 Other amnesia: Secondary | ICD-10-CM

## 2022-07-27 DIAGNOSIS — R2689 Other abnormalities of gait and mobility: Secondary | ICD-10-CM

## 2022-07-27 DIAGNOSIS — E512 Wernicke's encephalopathy: Secondary | ICD-10-CM

## 2022-08-08 ENCOUNTER — Other Ambulatory Visit: Payer: Self-pay | Admitting: Physician Assistant

## 2022-08-08 DIAGNOSIS — E512 Wernicke's encephalopathy: Secondary | ICD-10-CM

## 2022-08-08 DIAGNOSIS — R2689 Other abnormalities of gait and mobility: Secondary | ICD-10-CM

## 2022-08-08 DIAGNOSIS — R413 Other amnesia: Secondary | ICD-10-CM

## 2022-08-26 ENCOUNTER — Encounter: Payer: Self-pay | Admitting: Physician Assistant

## 2022-08-29 ENCOUNTER — Other Ambulatory Visit: Payer: Medicare Other

## 2022-08-31 DIAGNOSIS — E538 Deficiency of other specified B group vitamins: Secondary | ICD-10-CM | POA: Diagnosis not present

## 2022-08-31 DIAGNOSIS — R413 Other amnesia: Secondary | ICD-10-CM | POA: Diagnosis not present

## 2022-08-31 DIAGNOSIS — G43009 Migraine without aura, not intractable, without status migrainosus: Secondary | ICD-10-CM | POA: Diagnosis not present

## 2022-08-31 DIAGNOSIS — E039 Hypothyroidism, unspecified: Secondary | ICD-10-CM | POA: Diagnosis not present

## 2022-08-31 DIAGNOSIS — D72829 Elevated white blood cell count, unspecified: Secondary | ICD-10-CM | POA: Diagnosis not present

## 2022-08-31 DIAGNOSIS — Z79899 Other long term (current) drug therapy: Secondary | ICD-10-CM | POA: Diagnosis not present

## 2022-08-31 DIAGNOSIS — M069 Rheumatoid arthritis, unspecified: Secondary | ICD-10-CM | POA: Diagnosis not present

## 2022-08-31 DIAGNOSIS — F419 Anxiety disorder, unspecified: Secondary | ICD-10-CM | POA: Diagnosis not present

## 2022-08-31 DIAGNOSIS — F32A Depression, unspecified: Secondary | ICD-10-CM | POA: Diagnosis not present

## 2022-08-31 DIAGNOSIS — M549 Dorsalgia, unspecified: Secondary | ICD-10-CM | POA: Diagnosis not present

## 2022-09-13 ENCOUNTER — Encounter: Payer: Self-pay | Admitting: Physician Assistant

## 2022-10-26 DIAGNOSIS — E039 Hypothyroidism, unspecified: Secondary | ICD-10-CM | POA: Diagnosis not present

## 2022-10-26 DIAGNOSIS — M069 Rheumatoid arthritis, unspecified: Secondary | ICD-10-CM | POA: Diagnosis not present

## 2022-10-26 DIAGNOSIS — G43009 Migraine without aura, not intractable, without status migrainosus: Secondary | ICD-10-CM | POA: Diagnosis not present

## 2022-10-26 DIAGNOSIS — F418 Other specified anxiety disorders: Secondary | ICD-10-CM | POA: Diagnosis not present

## 2023-06-15 DIAGNOSIS — F17220 Nicotine dependence, chewing tobacco, uncomplicated: Secondary | ICD-10-CM | POA: Diagnosis not present

## 2023-06-15 DIAGNOSIS — N189 Chronic kidney disease, unspecified: Secondary | ICD-10-CM | POA: Diagnosis not present

## 2023-06-15 DIAGNOSIS — Z886 Allergy status to analgesic agent status: Secondary | ICD-10-CM | POA: Diagnosis not present

## 2023-06-15 DIAGNOSIS — H25811 Combined forms of age-related cataract, right eye: Secondary | ICD-10-CM | POA: Diagnosis not present

## 2023-06-15 DIAGNOSIS — H269 Unspecified cataract: Secondary | ICD-10-CM | POA: Diagnosis not present

## 2023-06-15 DIAGNOSIS — E039 Hypothyroidism, unspecified: Secondary | ICD-10-CM | POA: Diagnosis not present

## 2023-11-24 DIAGNOSIS — R001 Bradycardia, unspecified: Secondary | ICD-10-CM | POA: Diagnosis not present

## 2023-11-24 DIAGNOSIS — I440 Atrioventricular block, first degree: Secondary | ICD-10-CM | POA: Diagnosis not present

## 2023-11-24 DIAGNOSIS — R0781 Pleurodynia: Secondary | ICD-10-CM | POA: Diagnosis not present

## 2023-11-24 DIAGNOSIS — R079 Chest pain, unspecified: Secondary | ICD-10-CM | POA: Diagnosis not present

## 2023-11-24 DIAGNOSIS — R9431 Abnormal electrocardiogram [ECG] [EKG]: Secondary | ICD-10-CM | POA: Diagnosis not present

## 2023-11-24 DIAGNOSIS — I444 Left anterior fascicular block: Secondary | ICD-10-CM | POA: Diagnosis not present

## 2023-11-24 DIAGNOSIS — R0602 Shortness of breath: Secondary | ICD-10-CM | POA: Diagnosis not present

## 2023-11-24 DIAGNOSIS — Z87891 Personal history of nicotine dependence: Secondary | ICD-10-CM | POA: Diagnosis not present

## 2023-12-12 NOTE — Progress Notes (Unsigned)
  Cardiology Office Note   Date:  12/14/2023  ID:  [name], DOB [birth year], MRN [id number] PCP: Montey Lot, PA-C  [facility] Providers Cardiologist:  Caron Poser, MD     History of Present Illness [name] is a 58 y.o. female PMH hypothyroidism, seronegative RA, depression who presents for further evaluation and management of chest discomfort.  Patient reports a 3 to 4-week history of constant chest discomfort.  She says the pain is sharp and located in the left side of her breast, left chest wall, and left jaw.  She says the pain is constant and has not noticed an exertional component.  She says it is made better with a heating pad.  Denies any palpitations.  She also has shortness of breath.  Last LDL 185 11/2023.  Relevant CVD History -CT chest OSH 08/2023 reports mild CAC  ROS: Pt denies any arm pain, palpitations, syncope, presyncope, orthopnea, PND, or LE edema.  Studies Reviewed I have independently reviewed the patient's ECG, recent medical records, recent blood work.  Physical Exam VS:  BP 110/74 (BP Location: Right Arm, Patient Position: Sitting, Cuff Size: Normal)   Pulse 61   Ht 5' 5 (1.651 m)   Wt 121 lb (54.9 kg)   SpO2 99%   BMI 20.14 kg/m        Wt Readings from Last 3 Encounters:  12/14/23 121 lb (54.9 kg)  04/28/20 135 lb (61.2 kg)    GEN: No acute distress. NECK: No JVD; No carotid bruits. CARDIAC: RRR, no murmurs, rubs, gallops. RESPIRATORY:  Clear to auscultation. EXTREMITIES:  Warm and well-perfused. No edema.  ASSESSMENT AND PLAN Chest discomfort DOE CAC Patient presents with chest discomfort and dyspnea on exertion in the setting of rheumatoid arthritis and mild CAC reported on an outside CT scan.  Symptoms have not improved with conservative treatment so far.  She is overall intermediate risk for obstructive CAD.  Further testing is indicated.  Plan: - Coronary CT angiogram; will need premedication protocol given mild  contrast allergy - Echocardiogram to rule out structural causes given concomitant dyspnea on exertion - Start ASA 81 mg daily; this is listed as an allergy on her medication list, though she reports it is tolerable.  If this ends up being intolerable, then we can do Plavix instead. - Start Crestor 5 mg daily; if tolerated, uptitrate to 10 mg  HLD LDL 185 11/2023.  This is significantly elevated.  Given her known CAC on her recent chest CT, her LDL goal is less than 70.  As above, we will start Crestor at 5 mg daily.  If this is tolerated, then we will increase to 10 mg.  She will need her lipids to be rechecked in 2 to 3 months.        Dispo: RTC as needed based on results of cardiac testing  Signed, Caron Poser, MD

## 2023-12-14 ENCOUNTER — Ambulatory Visit

## 2023-12-14 ENCOUNTER — Other Ambulatory Visit (HOSPITAL_COMMUNITY): Payer: Self-pay

## 2023-12-14 VITALS — BP 110/74 | HR 61 | Ht 65.0 in | Wt 121.0 lb

## 2023-12-14 DIAGNOSIS — R072 Precordial pain: Secondary | ICD-10-CM

## 2023-12-14 DIAGNOSIS — R079 Chest pain, unspecified: Secondary | ICD-10-CM | POA: Diagnosis not present

## 2023-12-14 DIAGNOSIS — Z79899 Other long term (current) drug therapy: Secondary | ICD-10-CM

## 2023-12-14 DIAGNOSIS — R0609 Other forms of dyspnea: Secondary | ICD-10-CM

## 2023-12-14 DIAGNOSIS — I2510 Atherosclerotic heart disease of native coronary artery without angina pectoris: Secondary | ICD-10-CM | POA: Diagnosis not present

## 2023-12-14 DIAGNOSIS — E782 Mixed hyperlipidemia: Secondary | ICD-10-CM

## 2023-12-14 MED ORDER — ROSUVASTATIN CALCIUM 5 MG PO TABS: 5.0000 mg | ORAL_TABLET | Freq: Every day | ORAL | 2 refills | Status: AC

## 2023-12-14 MED ORDER — PREDNISONE 50 MG PO TABS: ORAL_TABLET | ORAL | 0 refills | Status: AC

## 2023-12-14 MED ORDER — METOPROLOL TARTRATE 25 MG PO TABS
ORAL_TABLET | ORAL | 0 refills | Status: AC
Start: 2023-12-14 — End: ?

## 2023-12-14 MED ORDER — DIPHENHYDRAMINE HCL 50 MG PO TABS: 50.0000 mg | ORAL_TABLET | Freq: Once | ORAL | 0 refills | Status: AC

## 2023-12-14 MED ORDER — ASPIRIN 81 MG PO TBEC: 81.0000 mg | DELAYED_RELEASE_TABLET | Freq: Every day | ORAL | Status: AC

## 2023-12-14 NOTE — Patient Instructions (Signed)
 Medication Instructions:  Your physician recommends the following medication changes.  Take all other medications as prescribed in addition to:  START TAKING: Aspirin 81 mg by mouth once daily Rosuvastatin (CRESTOR) 5 mg by mouth once daily x 10 days, if tolerating increase dose to    10 mg by mouth once daily   *If you need a refill on your cardiac medications before your next appointment, please call your pharmacy*  Lab Work: Your provider would like for you to have following labs drawn today BMET.    If you have labs (blood work) drawn today and your tests are completely normal, you will receive your results only by: MyChart Message (if you have MyChart) OR A paper copy in the mail If you have any lab test that is abnormal or we need to change your treatment, we will call you to review the results.  Testing/Procedures: Your physician has requested that you have an echocardiogram. Echocardiography is a painless test that uses sound waves to create images of your heart. It provides your doctor with information about the size and shape of your heart and how well your heart's chambers and valves are working.   You may receive an ultrasound enhancing agent through an IV if needed to better visualize your heart during the echo. This procedure takes approximately one hour.  There are no restrictions for this procedure.  This will take place at [address] [facility] [address], [place] [address]  Please note: We ask at that you not bring children with you during ultrasound (echo/ vascular) testing. Due to room size and safety concerns, children are not allowed in the ultrasound rooms during exams. Our front office staff cannot provide observation of children in our lobby area while testing is being conducted. An adult accompanying a patient to their appointment will only be allowed in the ultrasound room at the discretion of the ultrasound technician under special  circumstances. We apologize for any inconvenience.     Your cardiac CT will be scheduled at one of the below locations:   [facility] [address] [place], [place] [address] [phone]  Please arrive 15 mins early for check-in and test prep.  There is spacious parking Copy Available) and easy access to the radiology department from the [facility] entrance. Please enter here and check-in with the desk attendant.   Please follow these instructions carefully (unless otherwise directed):  An IV will be required for this test and Nitroglycerin will be given.    On the Night Before the Test: Be sure to Drink plenty of water. Do not consume any caffeinated/decaffeinated beverages or chocolate 12 hours prior to your test. Do not take any antihistamines 12 hours prior to your test. If the patient has contrast allergy: Patient will need a prescription for Prednisone and very clear instructions (as follows): Prednisone 50 mg - take 13 hours prior to test Take another Prednisone 50 mg 7 hours prior to test Take another Prednisone 50 mg 1 hour prior to test Take Benadryl 50 mg 1 hour prior to test Patient must complete all four doses of above prophylactic medications. Patient will need a ride after test due to Benadryl.  On the Day of the Test: Drink plenty of water until 1 hour prior to the test. Do not eat any food 1 hour prior to test. You may take your regular medications prior to the test.  Take metoprolol (Lopressor) 25 MG two hours prior  to test. FEMALES- please wear underwire-free bra if available, avoid dresses & tight clothing    After the Test: Drink plenty of water. After receiving IV contrast, you may experience a mild flushed feeling. This is normal. On occasion, you may experience a mild rash up to 24 hours after the test. This is not dangerous. If this occurs, you can take Benadryl 25 mg, Zyrtec, Claritin, or Allegra and  increase your fluid intake. (Patients taking Tikosyn should avoid Benadryl, and may take Zyrtec, Claritin, or Allegra) If you experience trouble breathing, this can be serious. If it is severe call 911 IMMEDIATELY. If it is mild, please call our office.  We will call to schedule your test 2-4 weeks out understanding that some insurance companies will need an authorization prior to the service being performed.   For more information and frequently asked questions, please visit our website : [email or web address]  For non-scheduling related questions, please contact the cardiac imaging nurse navigator should you have any questions/concerns: Cardiac Imaging Nurse Navigators Direct Office Dial: [phone]   For scheduling needs, including cancellations and rescheduling, please call Brittany, [phone].    Follow-Up: At [facility], you and your health needs are our priority.  As part of our continuing mission to provide you with exceptional heart care, our providers are all part of one team.  This team includes your primary Cardiologist (physician) and Advanced Practice Providers or APPs (Physician Assistants and Nurse Practitioners) who all work together to provide you with the care you need, when you need it.  Your next appointment:  As Needed (depending on results of test)   Provider:  Caron Poser, MD    We recommend signing up for the patient portal called MyChart.  Sign up information is provided on this After Visit Summary.  MyChart is used to connect with patients for Virtual Visits (Telemedicine).  Patients are able to view lab/test results, encounter notes, upcoming appointments, etc.  Non-urgent messages can be sent to your provider as well.   To learn more about what you can do with MyChart, go to [email or web address].

## 2023-12-15 ENCOUNTER — Ambulatory Visit: Payer: Self-pay

## 2023-12-15 LAB — BASIC METABOLIC PANEL WITH GFR
BUN/Creatinine Ratio: 8 — ABNORMAL LOW (ref 9–23)
BUN: 8 mg/dL (ref 6–24)
CO2: 20 mmol/L (ref 20–29)
Calcium: 9.7 mg/dL (ref 8.7–10.2)
Chloride: 102 mmol/L (ref 96–106)
Creatinine, Ser: 1.03 mg/dL — ABNORMAL HIGH (ref 0.57–1.00)
Glucose: 71 mg/dL (ref 70–99)
Potassium: 3.8 mmol/L (ref 3.5–5.2)
Sodium: 140 mmol/L (ref 134–144)
eGFR: 63 mL/min/1.73 (ref >59)

## 2023-12-18 ENCOUNTER — Other Ambulatory Visit (HOSPITAL_COMMUNITY): Payer: Self-pay

## 2023-12-18 ENCOUNTER — Telehealth: Payer: Self-pay | Admitting: Pharmacy Technician

## 2023-12-18 NOTE — Telephone Encounter (Signed)
 Pharmacy Patient Advocate Encounter   Received notification from CoverMyMeds that prior authorization for rosuvastatin is required/requested.   Insurance verification completed.   The patient is insured through [employer].   Per test claim: The current 12/18/23 day co-pay is, $0.00- one month.  No PA needed at this time. This test claim was processed through [facility]- copay amounts may vary at other pharmacies due to pharmacy/plan contracts, or as the patient moves through the different stages of their insurance plan.

## 2024-02-07 ENCOUNTER — Ambulatory Visit

## 2024-02-07 DIAGNOSIS — I2510 Atherosclerotic heart disease of native coronary artery without angina pectoris: Secondary | ICD-10-CM | POA: Diagnosis not present

## 2024-02-07 DIAGNOSIS — R0609 Other forms of dyspnea: Secondary | ICD-10-CM

## 2024-02-07 DIAGNOSIS — R079 Chest pain, unspecified: Secondary | ICD-10-CM

## 2024-02-07 LAB — ECHOCARDIOGRAM COMPLETE
AR max vel: 2.73 cm2
AV Area VTI: 2.45 cm2
AV Area mean vel: 2.54 cm2
AV Mean grad: 4 mmHg
AV Peak grad: 7.4 mmHg
Ao pk vel: 1.36 m/s
Area-P 1/2: 2.76 cm2
S' Lateral: 2.6 cm

## 2024-02-23 ENCOUNTER — Telehealth (HOSPITAL_COMMUNITY): Payer: Self-pay | Admitting: *Deleted

## 2024-02-23 NOTE — Telephone Encounter (Signed)
 Attempted to call patient regarding upcoming cardiac CT appointment. Left message on voicemail with name and callback number  Larey Brick RN Navigator Cardiac Imaging [facility] [phone] Office [phone] Cell

## 2024-02-26 ENCOUNTER — Ambulatory Visit: Payer: Medicare (Managed Care)

## 2024-03-21 ENCOUNTER — Ambulatory Visit: Payer: Medicare (Managed Care)
